# Patient Record
Sex: Female | Born: 2005 | Race: White | Hispanic: No | Marital: Single | Smoking: Never smoker
Health system: Southern US, Community
[De-identification: ages and names within clinical notes are randomized; demographics above are authoritative.]

## PROBLEM LIST (undated history)

## (undated) DIAGNOSIS — F32A Depression, unspecified: Secondary | ICD-10-CM

## (undated) DIAGNOSIS — Q793 Gastroschisis: Secondary | ICD-10-CM

## (undated) DIAGNOSIS — F329 Major depressive disorder, single episode, unspecified: Secondary | ICD-10-CM

## (undated) HISTORY — PX: TYMPANOSTOMY TUBE PLACEMENT: SHX32

---
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
## 2024-01-25 ENCOUNTER — Ambulatory Visit: Payer: Managed Care, Other (non HMO) | Admitting: Primary Care
